# Patient Record
Sex: Female | Born: 1955 | Race: White | Hispanic: No
Health system: Southern US, Community
[De-identification: ages and names within clinical notes are randomized; demographics above are authoritative.]

## PROBLEM LIST (undated history)

## (undated) HISTORY — PX: ABDOMINAL HYSTERECTOMY: SHX81

---

## 2017-08-30 DIAGNOSIS — E039 Hypothyroidism, unspecified: Secondary | ICD-10-CM | POA: Insufficient documentation

## 2017-08-30 DIAGNOSIS — F411 Generalized anxiety disorder: Secondary | ICD-10-CM | POA: Insufficient documentation

## 2017-08-30 DIAGNOSIS — L508 Other urticaria: Secondary | ICD-10-CM | POA: Insufficient documentation

## 2017-08-30 DIAGNOSIS — K219 Gastro-esophageal reflux disease without esophagitis: Secondary | ICD-10-CM | POA: Insufficient documentation

## 2017-11-29 DIAGNOSIS — E669 Obesity, unspecified: Secondary | ICD-10-CM | POA: Insufficient documentation

## 2017-11-29 DIAGNOSIS — F5104 Psychophysiologic insomnia: Secondary | ICD-10-CM | POA: Insufficient documentation

## 2017-11-29 DIAGNOSIS — E119 Type 2 diabetes mellitus without complications: Secondary | ICD-10-CM | POA: Insufficient documentation

## 2018-05-31 DIAGNOSIS — E6609 Other obesity due to excess calories: Secondary | ICD-10-CM | POA: Insufficient documentation

## 2019-01-03 ENCOUNTER — Other Ambulatory Visit: Payer: Self-pay | Admitting: Internal Medicine

## 2019-01-03 DIAGNOSIS — Z1231 Encounter for screening mammogram for malignant neoplasm of breast: Secondary | ICD-10-CM

## 2019-04-03 DIAGNOSIS — L719 Rosacea, unspecified: Secondary | ICD-10-CM | POA: Insufficient documentation

## 2019-10-05 ENCOUNTER — Other Ambulatory Visit: Payer: Self-pay | Admitting: Family Medicine

## 2019-10-05 DIAGNOSIS — Z1231 Encounter for screening mammogram for malignant neoplasm of breast: Secondary | ICD-10-CM

## 2019-10-11 ENCOUNTER — Ambulatory Visit
Admission: RE | Admit: 2019-10-11 | Discharge: 2019-10-11 | Disposition: A | Discharge status: Home / Self Care | Payer: BC Managed Care – PPO | Source: Ambulatory Visit | Attending: Family Medicine | Admitting: Family Medicine

## 2019-10-11 DIAGNOSIS — Z1231 Encounter for screening mammogram for malignant neoplasm of breast: Secondary | ICD-10-CM | POA: Insufficient documentation

## 2019-10-30 ENCOUNTER — Other Ambulatory Visit: Payer: Self-pay | Admitting: Family Medicine

## 2019-10-30 DIAGNOSIS — N6310 Unspecified lump in the right breast, unspecified quadrant: Secondary | ICD-10-CM

## 2019-10-30 DIAGNOSIS — R928 Other abnormal and inconclusive findings on diagnostic imaging of breast: Secondary | ICD-10-CM

## 2019-11-02 ENCOUNTER — Ambulatory Visit
Admission: RE | Admit: 2019-11-02 | Discharge: 2019-11-02 | Disposition: A | Discharge status: Home / Self Care | Payer: BC Managed Care – PPO | Source: Ambulatory Visit | Attending: Family Medicine | Admitting: Family Medicine

## 2019-11-02 DIAGNOSIS — R928 Other abnormal and inconclusive findings on diagnostic imaging of breast: Secondary | ICD-10-CM

## 2019-11-02 DIAGNOSIS — N6310 Unspecified lump in the right breast, unspecified quadrant: Secondary | ICD-10-CM

## 2020-04-09 DIAGNOSIS — E7800 Pure hypercholesterolemia, unspecified: Secondary | ICD-10-CM | POA: Insufficient documentation

## 2020-04-12 ENCOUNTER — Other Ambulatory Visit: Payer: Self-pay | Admitting: Family Medicine

## 2020-04-12 DIAGNOSIS — N6310 Unspecified lump in the right breast, unspecified quadrant: Secondary | ICD-10-CM

## 2020-04-29 ENCOUNTER — Ambulatory Visit
Admission: RE | Admit: 2020-04-29 | Discharge: 2020-04-29 | Disposition: A | Discharge status: Home / Self Care | Payer: BC Managed Care – PPO | Source: Ambulatory Visit | Attending: Family Medicine | Admitting: Family Medicine

## 2020-04-29 DIAGNOSIS — N6310 Unspecified lump in the right breast, unspecified quadrant: Secondary | ICD-10-CM

## 2020-04-30 ENCOUNTER — Other Ambulatory Visit: Payer: Self-pay | Admitting: Family Medicine

## 2020-04-30 ENCOUNTER — Encounter: Payer: Self-pay | Admitting: Obstetrics and Gynecology

## 2020-04-30 ENCOUNTER — Ambulatory Visit (INDEPENDENT_AMBULATORY_CARE_PROVIDER_SITE_OTHER): Payer: BC Managed Care – PPO | Admitting: Obstetrics and Gynecology

## 2020-04-30 ENCOUNTER — Other Ambulatory Visit: Payer: Self-pay

## 2020-04-30 VITALS — BP 132/70 | Ht 62.0 in | Wt 178.6 lb

## 2020-04-30 DIAGNOSIS — Z01419 Encounter for gynecological examination (general) (routine) without abnormal findings: Secondary | ICD-10-CM

## 2020-04-30 DIAGNOSIS — N6002 Solitary cyst of left breast: Secondary | ICD-10-CM

## 2020-04-30 DIAGNOSIS — Z1231 Encounter for screening mammogram for malignant neoplasm of breast: Secondary | ICD-10-CM

## 2020-04-30 DIAGNOSIS — N6001 Solitary cyst of right breast: Secondary | ICD-10-CM

## 2020-04-30 NOTE — Patient Instructions (Signed)
Institute of Medicine Recommended Dietary Allowances for Calcium and Vitamin D  Age (yr) Calcium Recommended Dietary Allowance (mg/day) Vitamin D Recommended Dietary Allowance (international units/day)  9-18 1,300 600  19-50 1,000 600  51-70 1,200 600  71 and older 1,200 800  Data from Institute of Medicine. Dietary reference intakes: calcium, vitamin D. [place], [place]: National Academies Press; 2011.     Exercising to Stay Healthy To become healthy and stay healthy, it is recommended that you do moderate-intensity and vigorous-intensity exercise. You can tell that you are exercising at a moderate intensity if your heart starts beating faster and you start breathing faster but can still hold a conversation. You can tell that you are exercising at a vigorous intensity if you are breathing much harder and faster and cannot hold a conversation while exercising. Exercising regularly is important. It has many health benefits, such as:  Improving overall fitness, flexibility, and endurance.  Increasing bone density.  Helping with weight control.  Decreasing body fat.  Increasing muscle strength.  Reducing stress and tension.  Improving overall health. How often should I exercise? Choose an activity that you enjoy, and set realistic goals. Your health care provider can help you make an activity plan that works for you. Exercise regularly as told by your health care provider. This may include:  Doing strength training two times a week, such as: ? Lifting weights. ? Using resistance bands. ? Push-ups. ? Sit-ups. ? Yoga.  Doing a certain intensity of exercise for a given amount of time. Choose from these options: ? A total of 150 minutes of moderate-intensity exercise every week. ? A total of 75 minutes of vigorous-intensity exercise every week. ? A mix of moderate-intensity and vigorous-intensity exercise every week. Children, pregnant women, people who have not exercised  regularly, people who are overweight, and older adults may need to talk with a health care provider about what activities are safe to do. If you have a medical condition, be sure to talk with your health care provider before you start a new exercise program. What are some exercise ideas? Moderate-intensity exercise ideas include:  Walking 1 mile (1.6 km) in about 15 minutes.  Biking.  Hiking.  Golfing.  Dancing.  Water aerobics. Vigorous-intensity exercise ideas include:  Walking 4.5 miles (7.2 km) or more in about 1 hour.  Jogging or running 5 miles (8 km) in about 1 hour.  Biking 10 miles (16.1 km) or more in about 1 hour.  Lap swimming.  Roller-skating or in-line skating.  Cross-country skiing.  Vigorous competitive sports, such as football, basketball, and soccer.  Jumping rope.  Aerobic dancing. What are some everyday activities that can help me to get exercise?  Yard work, such as: ? Pushing a lawn mower. ? Raking and bagging leaves.  Washing your car.  Pushing a stroller.  Shoveling snow.  Gardening.  Washing windows or floors. How can I be more active in my day-to-day activities?  Use stairs instead of an elevator.  Take a walk during your lunch break.  If you drive, park your car farther away from your work or school.  If you take public transportation, get off one stop early and walk the rest of the way.  Stand up or walk around during all of your indoor phone calls.  Get up, stretch, and walk around every 30 minutes throughout the day.  Enjoy exercise with a friend. Support to continue exercising will help you keep a regular routine of activity. What guidelines can   I follow while exercising?  Before you start a new exercise program, talk with your health care provider.  Do not exercise so much that you hurt yourself, feel dizzy, or get very short of breath.  Wear comfortable clothes and wear shoes with good support.  Drink plenty of  water while you exercise to prevent dehydration or heat stroke.  Work out until your breathing and your heartbeat get faster. Where to find more information  [place] Department of Health and Human Services: www.hhs.gov  Centers for Disease Control and Prevention (CDC): www.cdc.gov Summary  Exercising regularly is important. It will improve your overall fitness, flexibility, and endurance.  Regular exercise also will improve your overall health. It can help you control your weight, reduce stress, and improve your bone density.  Do not exercise so much that you hurt yourself, feel dizzy, or get very short of breath.  Before you start a new exercise program, talk with your health care provider. This information is not intended to replace advice given to you by your health care provider. Make sure you discuss any questions you have with your health care provider. Document Revised: 10/01/2017 Document Reviewed: 09/09/2017 Elsevier Patient Education  2020 Elsevier Inc.   Budget-Friendly Healthy Eating There are many ways to save money at the grocery store and continue to eat healthy. You can be successful if you:  Plan meals according to your budget.  Make a grocery list and only purchase food according to your grocery list.  Prepare food yourself. What are tips for following this plan?  Reading food labels  Compare food labels between brand name foods and the store brand. Often the nutritional value is the same, but the store brand is lower cost.  Look for products that do not have added sugar, fat, or salt (sodium). These often cost the same but are healthier for you. Products may be labeled as: ? Sugar-free. ? Nonfat. ? Low-fat. ? Sodium-free. ? Low-sodium.  Look for lean ground beef labeled as at least 92% lean and 8% fat. Shopping  Buy only the items on your grocery list and go only to the areas of the store that have the items on your list.  Use coupons only for foods  and brands you normally buy. Avoid buying items you wouldn't normally buy simply because they are on sale.  Check online and in newspapers for weekly deals.  Buy healthy items from the bulk bins when available, such as herbs, spices, flour, pasta, nuts, and dried fruit.  Buy fruits and vegetables that are in season. Prices are usually lower on in-season produce.  Look at the unit price on the price tag. Use it to compare different brands and sizes to find out which item is the best deal.  Choose healthy items that are often low-cost, such as carrots, potatoes, apples, bananas, and oranges. Dried or canned beans are a low-cost protein source.  Buy in bulk and freeze extra food. Items you can buy in bulk include meats, fish, poultry, frozen fruits, and frozen vegetables.  Avoid buying "ready-to-eat" foods, such as pre-cut fruits and vegetables and pre-made salads.  If possible, shop around to discover where you can find the best prices. Consider other retailers such as dollar stores, larger wholesale stores, local fruit and vegetable stands, and farmers markets.  Do not shop when you are hungry. If you shop while hungry, it may be hard to stick to your list and budget.  Resist impulse buying. Use your grocery list as   your official plan for the week.  Buy a variety of vegetables and fruits by purchasing fresh, frozen, and canned items.  Look at the top and bottom shelves for deals. Foods at eye level (eye level of an adult or child) are usually more expensive.  Be efficient with your time when shopping. The more time you spend at the store, the more money you are likely to spend.  To save money when choosing more expensive foods like meats and dairy: ? Choose cheaper cuts of meat, such as bone-in chicken thighs and drumsticks instead of skinless and boneless chicken. When you are ready to prepare the chicken, you can remove the skin yourself to make it healthier. ? Choose lean meats like  chicken or turkey instead of beef. ? Choose canned seafood, such as tuna, salmon, or sardines. ? Buy eggs as a low-cost source of protein. ? Buy dried beans and peas, such as lentils, split peas, or kidney beans instead of meats. Dried beans and peas are a good alternative source of protein. ? Buy the larger tubs of yogurt instead of individual-sized containers.  Choose water instead of sodas and other sweetened beverages.  Avoid buying chips, cookies, and other "junk food." These items are usually expensive and not healthy. Cooking  Make extra food and freeze the extras in meal-sized containers or in individual portions for fast meals and snacks.  Pre-cook on days when you have extra time to prepare meals in advance. You can keep these meals in the fridge or freezer and reheat for a quick meal.  When you come home from the grocery store, wash, peel, and cut fruits and vegetables so they are ready to use and eat. This will help reduce food waste. Meal planning  Do not eat out or get fast food. Prepare food at home.  Make a grocery list and make sure to bring it with you to the store. If you have a smart phone, you could use your phone to create your shopping list.  Plan meals and snacks according to a grocery list and budget you create.  Use leftovers in your meal plan for the week.  Look for recipes where you can cook once and make enough food for two meals.  Include budget-friendly meals like stews, casseroles, and stir-fry dishes.  Try some meatless meals or try "no cook" meals like salads.  Make sure that half your plate is filled with fruits or vegetables. Choose from fresh, frozen, or canned fruits and vegetables. If eating canned, remember to rinse them before eating. This will remove any excess salt added for packaging. Summary  Eating healthy on a budget is possible if you plan your meals according to your budget, purchase according to your budget and grocery list, and  prepare food yourself.  Tips for buying more food on a limited budget include buying generic brands, using coupons only for foods you normally buy, and buying healthy items from the bulk bins when available.  Tips for buying cheaper food to replace expensive food include choosing cheaper, lean cuts of meat, and buying dried beans and peas. This information is not intended to replace advice given to you by your health care provider. Make sure you discuss any questions you have with your health care provider. Document Revised: 10/20/2017 Document Reviewed: 10/20/2017 Elsevier Patient Education  2020 Elsevier Inc.   Bone Health Bones protect organs, store calcium, anchor muscles, and support the whole body. Keeping your bones strong is important, especially as you   get older. You can take actions to help keep your bones strong and healthy. Why is keeping my bones healthy important?  Keeping your bones healthy is important because your body constantly replaces bone cells. Cells get old, and new cells take their place. As we age, we lose bone cells because the body may not be able to make enough new cells to replace the old cells. The amount of bone cells and bone tissue you have is referred to as bone mass. The higher your bone mass, the stronger your bones. The aging process leads to an overall loss of bone mass in the body, which can increase the likelihood of:  Joint pain and stiffness.  Broken bones.  A condition in which the bones become weak and brittle (osteoporosis). A large decline in bone mass occurs in older adults. In women, it occurs about the time of menopause. What actions can I take to keep my bones healthy? Good health habits are important for maintaining healthy bones. This includes eating nutritious foods and exercising regularly. To have healthy bones, you need to get enough of the right minerals and vitamins. Most nutrition experts recommend getting these nutrients from the  foods that you eat. In some cases, taking supplements may also be recommended. Doing certain types of exercise is also important for bone health. What are the nutritional recommendations for healthy bones?  Eating a well-balanced diet with plenty of calcium and vitamin D will help to protect your bones. Nutritional recommendations vary from person to person. Ask your health care provider what is healthy for you. Here are some general guidelines. Get enough calcium Calcium is the most important (essential) mineral for bone health. Most people can get enough calcium from their diet, but supplements may be recommended for people who are at risk for osteoporosis. Good sources of calcium include:  Dairy products, such as low-fat or nonfat milk, cheese, and yogurt.  Dark green leafy vegetables, such as bok choy and broccoli.  Calcium-fortified foods, such as orange juice, cereal, bread, soy beverages, and tofu products.  Nuts, such as almonds. Follow these recommended amounts for daily calcium intake:  Children, age 1-3: 700 mg.  Children, age 4-8: 1,000 mg.  Children, age 9-13: 1,300 mg.  Teens, age 14-18: 1,300 mg.  Adults, age 19-50: 1,000 mg.  Adults, age 51-70: ? Men: 1,000 mg. ? Women: 1,200 mg.  Adults, age 71 or older: 1,200 mg.  Pregnant and breastfeeding females: ? Teens: 1,300 mg. ? Adults: 1,000 mg. Get enough vitamin D Vitamin D is the most essential vitamin for bone health. It helps the body absorb calcium. Sunlight stimulates the skin to make vitamin D, so be sure to get enough sunlight. If you live in a cold climate or you do not get outside often, your health care provider may recommend that you take vitamin D supplements. Good sources of vitamin D in your diet include:  Egg yolks.  Saltwater fish.  Milk and cereal fortified with vitamin D. Follow these recommended amounts for daily vitamin D intake:  Children and teens, age 1-18: 600 international  units.  Adults, age 50 or younger: 400-800 international units.  Adults, age 51 or older: 800-1,000 international units. Get other important nutrients Other nutrients that are important for bone health include:  Phosphorus. This mineral is found in meat, poultry, dairy foods, nuts, and legumes. The recommended daily intake for adult men and adult women is 700 mg.  Magnesium. This mineral is found in seeds, nuts, dark   green vegetables, and legumes. The recommended daily intake for adult men is 400-420 mg. For adult women, it is 310-320 mg.  Vitamin K. This vitamin is found in green leafy vegetables. The recommended daily intake is 120 mg for adult men and 90 mg for adult women. What type of physical activity is best for building and maintaining healthy bones? Weight-bearing and strength-building activities are important for building and maintaining healthy bones. Weight-bearing activities cause muscles and bones to work against gravity. Strength-building activities increase the strength of the muscles that support bones. Weight-bearing and muscle-building activities include:  Walking and hiking.  Jogging and running.  Dancing.  Gym exercises.  Lifting weights.  Tennis and racquetball.  Climbing stairs.  Aerobics. Adults should get at least 30 minutes of moderate physical activity on most days. Children should get at least 60 minutes of moderate physical activity on most days. Ask your health care provider what type of exercise is best for you. How can I find out if my bone mass is low? Bone mass can be measured with an X-ray test called a bone mineral density (BMD) test. This test is recommended for all women who are age 65 or older. It may also be recommended for:  Men who are age 70 or older.  People who are at risk for osteoporosis because of: ? Having bones that break easily. ? Having a long-term disease that weakens bones, such as kidney disease or rheumatoid  arthritis. ? Having menopause earlier than normal. ? Taking medicine that weakens bones, such as steroids, thyroid hormones, or hormone treatment for breast cancer or prostate cancer. ? Smoking. ? Drinking three or more alcoholic drinks a day. If you find that you have a low bone mass, you may be able to prevent osteoporosis or further bone loss by changing your diet and lifestyle. Where can I find more information? For more information, check out the following websites:  National Osteoporosis Foundation: www.nof.org/patients  National Institutes of Health: www.bones.nih.gov  International Osteoporosis Foundation: www.iofbonehealth.org Summary  The aging process leads to an overall loss of bone mass in the body, which can increase the likelihood of broken bones and osteoporosis.  Eating a well-balanced diet with plenty of calcium and vitamin D will help to protect your bones.  Weight-bearing and strength-building activities are also important for building and maintaining strong bones.  Bone mass can be measured with an X-ray test called a bone mineral density (BMD) test. This information is not intended to replace advice given to you by your health care provider. Make sure you discuss any questions you have with your health care provider. Document Revised: 11/15/2017 Document Reviewed: 11/15/2017 Elsevier Patient Education  2020 Elsevier Inc.   

## 2020-04-30 NOTE — Progress Notes (Signed)
Patient ID: Shelby Clements, female   DOB: 12/13/1955, 64 y.o.   MRN: [id number]  Reason for Consult: Gynecologic Exam   Referred by No ref. provider found  Subjective:     HPI:  Shelby Clements is a 64 y.o. female. She reports that she may of had a partial hysterectomy in the past. She is uncertain if her cervix is remaining. She reports she did not have a history of abnormal pap smears before her hysterectomy. She still has her ovaries and reports she did not need to take hormones.  Gynecological History Hx of hysterectomy in 74s, still has ovaries. Cervix was removed per today's vaginal exam Last Mammogram: June 2021, is having a 5 mm complicated cyst monitored Sexually Active: yes, no pain   History reviewed. No pertinent past medical history. Family History  Problem Relation Age of Onset  . Breast cancer Neg Hx    History reviewed. No pertinent surgical history.  Short Social History:  Social History   Tobacco Use  . Smoking status: Not on file  Substance Use Topics  . Alcohol use: Not on file    Not on File  Current Outpatient Medications  Medication Sig Dispense Refill  . doxycycline (PERIOSTAT) 20 MG tablet Take by mouth.    . fluticasone (FLONASE) 50 MCG/ACT nasal spray Place into the nose.    . levothyroxine (SYNTHROID) 50 MCG tablet Take by mouth.    Marland Kitchen omeprazole (PRILOSEC) 20 MG capsule Take 1 tablet by mouth daily.    . pravastatin (PRAVACHOL) 20 MG tablet Take by mouth.    . sertraline (ZOLOFT) 100 MG tablet Take 1 tablet by mouth daily.     No current facility-administered medications for this visit.    Review of Systems  Constitutional: Negative for chills, fatigue, fever and unexpected weight change.  HENT: Negative for trouble swallowing.  Eyes: Negative for loss of vision.  Respiratory: Negative for cough, shortness of breath and wheezing.  Cardiovascular: Negative for chest pain, leg swelling, palpitations and syncope.  GI: Negative for  abdominal pain, blood in stool, diarrhea, nausea and vomiting.  GU: Negative for difficulty urinating, dysuria, frequency and hematuria.  Musculoskeletal: Negative for back pain, leg pain and joint pain.  Skin: Negative for rash.  Neurological: Negative for dizziness, headaches, light-headedness, numbness and seizures.  Psychiatric: Negative for behavioral problem, confusion, depressed mood and sleep disturbance.        Objective:  Objective   Vitals:   04/30/20 1041  BP: 132/70  Weight: 178 lb 9.6 oz (81 kg)  Height: 5\' 2"  (1.575 m)   Body mass index is 32.67 kg/m.  Physical Exam Vitals and nursing note reviewed.  Constitutional:      Appearance: She is well-developed.  HENT:     Head: Normocephalic and atraumatic.  Eyes:     Pupils: Pupils are equal, round, and reactive to light.  Cardiovascular:     Rate and Rhythm: Normal rate and regular rhythm.  Pulmonary:     Effort: Pulmonary effort is normal. No respiratory distress.  Abdominal:     General: Abdomen is flat.     Palpations: Abdomen is soft.  Genitourinary:    Comments: External: Normal appearing vulva. No lesions noted.  Speculum examination: Normal appearing cuff. Cervix absent. No blood in the vaginal vault. No discharge.  Bimanual examination: Uterus absent. No adnexal masses. No adnexal tenderness. Pelvis not fixed.  Skin:    General: Skin is warm and dry.  Neurological:  Mental Status: She is alert and oriented to person, place, and time.  Psychiatric:        Behavior: Behavior normal.        Thought Content: Thought content normal.        Judgment: Judgment normal.        Assessment/Plan:     64 yo  Pelvic exam normal today Cervix is surgically absent- pap smears can be discontinues Recommended annual pelvic exam to monitor ovaries.  Follow up of Mammogram planned for December, PCP is managing. Recent colonoscopy, advised to repeat in 5 years.   More than 30 minutes were spent face to  face with the patient in the room, reviewing the medical record, labs and images, and coordinating care for the patient. The plan of management was discussed in detail and counseling was provided.    Adelene Idler MD [facility], [facility] 04/30/2020 11:21 AM

## 2020-10-11 ENCOUNTER — Other Ambulatory Visit: Payer: Self-pay

## 2020-10-11 ENCOUNTER — Ambulatory Visit
Admission: RE | Admit: 2020-10-11 | Discharge: 2020-10-11 | Disposition: A | Discharge status: Home / Self Care | Payer: BC Managed Care – PPO | Source: Ambulatory Visit | Attending: Family Medicine | Admitting: Family Medicine

## 2020-10-11 DIAGNOSIS — N6001 Solitary cyst of right breast: Secondary | ICD-10-CM

## 2020-10-11 DIAGNOSIS — Z1231 Encounter for screening mammogram for malignant neoplasm of breast: Secondary | ICD-10-CM | POA: Insufficient documentation

## 2021-09-08 ENCOUNTER — Other Ambulatory Visit: Payer: Self-pay

## 2021-09-08 ENCOUNTER — Ambulatory Visit (INDEPENDENT_AMBULATORY_CARE_PROVIDER_SITE_OTHER): Payer: Medicare HMO | Admitting: Obstetrics and Gynecology

## 2021-09-08 ENCOUNTER — Encounter: Payer: Self-pay | Admitting: Obstetrics and Gynecology

## 2021-09-08 VITALS — BP 120/70 | Ht 62.0 in | Wt 189.8 lb

## 2021-09-08 DIAGNOSIS — Z13820 Encounter for screening for osteoporosis: Secondary | ICD-10-CM

## 2021-09-08 DIAGNOSIS — Z01419 Encounter for gynecological examination (general) (routine) without abnormal findings: Secondary | ICD-10-CM | POA: Diagnosis not present

## 2021-09-08 DIAGNOSIS — Z1231 Encounter for screening mammogram for malignant neoplasm of breast: Secondary | ICD-10-CM | POA: Diagnosis not present

## 2021-09-08 DIAGNOSIS — N6001 Solitary cyst of right breast: Secondary | ICD-10-CM | POA: Diagnosis not present

## 2021-09-08 DIAGNOSIS — Z0000 Encounter for general adult medical examination without abnormal findings: Secondary | ICD-10-CM

## 2021-09-08 NOTE — Progress Notes (Signed)
Gynecology Annual Exam  PCP: Marisue Ivan, MD  Chief Complaint:  Chief Complaint  Patient presents with   Gynecologic Exam    History of Present Illness: Patient is a 65 y.o. G2P2002 presents for annual exam. The patient has no complaints today.   LMP: No LMP recorded. Patient has had a hysterectomy. She denies postmenopausal bleeding or spotting  The patient is sexually active. She denies dyspareunia.  Postcoital Bleeding: no   The patient does perform self breast exams.  There is no notable family history of breast or ovarian cancer in her family.  The patient has regular exercise: walking and strength class 4 times a week  The patient denies current symptoms of depression.   PHQ-9: 2 GAD-7: 1   Review of Systems: Review of Systems  Constitutional:  Negative for chills, fever, malaise/fatigue and weight loss.  HENT:  Negative for congestion, hearing loss and sinus pain.   Eyes:  Negative for blurred vision and double vision.  Respiratory:  Negative for cough, sputum production, shortness of breath and wheezing.   Cardiovascular:  Negative for chest pain, palpitations, orthopnea and leg swelling.  Gastrointestinal:  Negative for abdominal pain, constipation, diarrhea, nausea and vomiting.  Genitourinary:  Negative for dysuria, flank pain, frequency, hematuria and urgency.  Musculoskeletal:  Negative for back pain, falls and joint pain.  Skin:  Negative for itching and rash.  Neurological:  Negative for dizziness and headaches.  Psychiatric/Behavioral:  Negative for depression, substance abuse and suicidal ideas. The patient is not nervous/anxious.    Past Medical History:  History reviewed. No pertinent past medical history.  Past Surgical History:  History reviewed. No pertinent surgical history.  Gynecologic History:  No LMP recorded. Patient has had a hysterectomy. Obstetric History: [id number]  Family History:  Family History  Problem Relation Age of  Onset   Breast cancer Neg Hx     Social History:  Social History   Socioeconomic History   Marital status: Unknown    Spouse name: Not on file   Number of children: Not on file   Years of education: Not on file   Highest education level: Not on file  Occupational History   Not on file  Tobacco Use   Smoking status: Not on file   Smokeless tobacco: Not on file  Substance and Sexual Activity   Alcohol use: Not on file   Drug use: Not on file   Sexual activity: Not on file  Other Topics Concern   Not on file  Social History Narrative   Not on file   Social Determinants of Health   Financial Resource Strain: Not on file  Food Insecurity: Not on file  Transportation Needs: Not on file  Physical Activity: Not on file  Stress: Not on file  Social Connections: Not on file  Intimate Partner Violence: Not on file    Allergies:  No Known Allergies  Medications: Prior to Admission medications   Medication Sig Start Date End Date Taking? Authorizing Provider  fluticasone (FLONASE) 50 MCG/ACT nasal spray Place into the nose. 05/06/18  Yes [provider]  levothyroxine (SYNTHROID) 50 MCG tablet Take by mouth. 04/09/20  Yes [provider]  omeprazole (PRILOSEC) 20 MG capsule Take 1 tablet by mouth daily. 02/05/20  Yes [provider]  sertraline (ZOLOFT) 100 MG tablet Take 1 tablet by mouth daily. 01/19/20  Yes [provider]  doxycycline (PERIOSTAT) 20 MG tablet Take by mouth. Patient not taking: Reported on 09/08/2021 09/19/17  [provider]  pravastatin (PRAVACHOL) 20 MG tablet Take by mouth. 04/09/20 04/09/21  [provider]    Physical Exam Vitals: Blood pressure 120/70, height 5\' 2"  (1.575 m), weight 189 lb 12.8 oz (86.1 kg).  Physical Exam Constitutional:      Appearance: She is well-developed.  Genitourinary:     Genitourinary Comments: External: Normal appearing vulva. No lesions noted.   Bimanual examination:  Uterus absent. No adnexal masses. No adnexal tenderness. Pelvis not fixed.  Breast Exam: breast equal without skin changes, nipple discharge, breast lump or enlarged lymph nodes   HENT:     Head: Normocephalic and atraumatic.  Neck:     Thyroid: No thyromegaly.  Cardiovascular:     Rate and Rhythm: Normal rate and regular rhythm.     Heart sounds: Normal heart sounds.  Pulmonary:     Effort: Pulmonary effort is normal.     Breath sounds: Normal breath sounds.  Abdominal:     General: Bowel sounds are normal. There is no distension.     Palpations: Abdomen is soft. There is no mass.  Musculoskeletal:     Cervical back: Neck supple.  Neurological:     Mental Status: She is alert and oriented to person, place, and time.  Skin:    General: Skin is warm and dry.  Psychiatric:        Behavior: Behavior normal.        Thought Content: Thought content normal.        Judgment: Judgment normal.  Vitals reviewed.     Female chaperone present for pelvic and breast  portions of the physical exam  Assessment: 65 y.o. G2P2002 routine annual exam  Plan: Problem List Items Addressed This Visit   None Visit Diagnoses     Encounter for annual routine gynecological examination    -  Primary   Cyst of right breast       Relevant Orders   76 BREAST LTD UNI RIGHT INC AXILLA (Completed)   Breast cancer screening by mammogram       Screening for osteoporosis       Health maintenance examination           1) Mammogram - recommend yearly screening mammogram.  Mammogram ordered today  2) STI screening was offered and declined  3) pap smears discontinued secondary to hysterectomy  4) Colonoscopy --advised   5) Routine healthcare maintenance including cholesterol, diabetes screening discussed managed by PCP  6) Osteoporosis screening - dexa ordered  US MD, Adelene [facility], [facility] 02/12/2022 1:13 PM

## 2021-09-08 NOTE — Patient Instructions (Addendum)
Institute of Medicine Recommended Dietary Allowances for Calcium and Vitamin D  Age (yr) Calcium Recommended Dietary Allowance (mg/day) Vitamin D Recommended Dietary Allowance (international units/day)  9-18 1,300 600  19-50 1,000 600  51-70 1,200 600  71 and older 1,200 800  Data from Institute of Medicine. Dietary reference intakes: calcium, vitamin D. [place], [place]: National Academies Press; 2011.   Exercising to Stay Healthy To become healthy and stay healthy, it is recommended that you do moderate-intensity and vigorous-intensity exercise. You can tell that you are exercising at a moderate intensity if your heart starts beating faster and you start breathing faster but can still hold a conversation. You can tell that you are exercising at a vigorous intensity if you are breathing much harder and faster and cannot hold a conversation while exercising. How can exercise benefit me? Exercising regularly is important. It has many health benefits, such as: Improving overall fitness, flexibility, and endurance. Increasing bone density. Helping with weight control. Decreasing body fat. Increasing muscle strength and endurance. Reducing stress and tension, anxiety, depression, or anger. Improving overall health. What guidelines should I follow while exercising? Before you start a new exercise program, talk with your health care provider. Do not exercise so much that you hurt yourself, feel dizzy, or get very short of breath. Wear comfortable clothes and wear shoes with good support. Drink plenty of water while you exercise to prevent dehydration or heat stroke. Work out until your breathing and your heartbeat get faster (moderate intensity). How often should I exercise? Choose an activity that you enjoy, and set realistic goals. Your health care provider can help you make an activity plan that is individually designed and works best for you. Exercise regularly as told by your health  care provider. This may include: Doing strength training two times a week, such as: Lifting weights. Using resistance bands. Push-ups. Sit-ups. Yoga. Doing a certain intensity of exercise for a given amount of time. Choose from these options: A total of 150 minutes of moderate-intensity exercise every week. A total of 75 minutes of vigorous-intensity exercise every week. A mix of moderate-intensity and vigorous-intensity exercise every week. Children, pregnant women, people who have not exercised regularly, people who are overweight, and older adults may need to talk with a health care provider about what activities are safe to perform. If you have a medical condition, be sure to talk with your health care provider before you start a new exercise program. What are some exercise ideas? Moderate-intensity exercise ideas include: Walking 1 mile (1.6 km) in about 15 minutes. Biking. Hiking. Golfing. Dancing. Water aerobics. Vigorous-intensity exercise ideas include: Walking 4.5 miles (7.2 km) or more in about 1 hour. Jogging or running 5 miles (8 km) in about 1 hour. Biking 10 miles (16.1 km) or more in about 1 hour. Lap swimming. Roller-skating or in-line skating. Cross-country skiing. Vigorous competitive sports, such as football, basketball, and soccer. Jumping rope. Aerobic dancing. What are some everyday activities that can help me get exercise? Yard work, such as: Pushing a lawn mower. Raking and bagging leaves. Washing your car. Pushing a stroller. Shoveling snow. Gardening. Washing windows or floors. How can I be more active in my day-to-day activities? Use stairs instead of an elevator. Take a walk during your lunch break. If you drive, park your car farther away from your work or school. If you take public transportation, get off one stop early and walk the rest of the way. Stand up or walk around during all of   your indoor phone calls. Get up, stretch, and walk  around every 30 minutes throughout the day. Enjoy exercise with a friend. Support to continue exercising will help you keep a regular routine of activity. Where to find more information You can find more information about exercising to stay healthy from: U.S. Department of Health and Human Services: www.hhs.gov Centers for Disease Control and Prevention (CDC): www.cdc.gov Summary Exercising regularly is important. It will improve your overall fitness, flexibility, and endurance. Regular exercise will also improve your overall health. It can help you control your weight, reduce stress, and improve your bone density. Do not exercise so much that you hurt yourself, feel dizzy, or get very short of breath. Before you start a new exercise program, talk with your health care provider. This information is not intended to replace advice given to you by your health care provider. Make sure you discuss any questions you have with your health care provider. Document Revised: 02/14/2021 Document Reviewed: 02/14/2021 Elsevier Patient Education  2022 Elsevier Inc. Budget-Friendly Healthy Eating There are many ways to save money at the grocery store and continue to eat healthy. You can be successful if you: Plan meals according to your budget. Make a grocery list and only purchase food according to your grocery list. Prepare food yourself at home. What are tips for following this plan? Reading food labels Compare food labels between brand name foods and the store brand. Often the nutritional value is the same, but the store brand is lower cost. Look for products that do not have added sugar, fat, or salt (sodium). These often cost the same but are healthier for you. Products may be labeled as: Sugar-free. Nonfat. Low-fat. Sodium-free. Low-sodium. Look for lean ground beef labeled as at least 92% lean and 8% fat. Shopping  Buy only the items on your grocery list and go only to the areas of the store  that have the items on your list. Use coupons only for foods and brands you normally buy. Avoid buying items you wouldn't normally buy simply because they are on sale. Check online and in newspapers for weekly deals. Buy healthy items from the bulk bins when available, such as herbs, spices, flour, pasta, nuts, and dried fruit. Buy fruits and vegetables that are in season. Prices are usually lower on in-season produce. Look at the unit price on the price tag. Use it to compare different brands and sizes to find out which item is the best deal. Choose healthy items that are often low-cost, such as carrots, potatoes, apples, bananas, and oranges. Dried or canned beans are a low-cost protein source. Buy in bulk and freeze extra food. Items you can buy in bulk include meats, fish, poultry, frozen fruits, and frozen vegetables. Avoid buying "ready-to-eat" foods, such as pre-cut fruits and vegetables and pre-made salads. If possible, shop around to discover where you can find the best prices. Consider other retailers such as dollar stores, larger wholesale stores, local fruit and vegetable stands, and farmers markets. Do not shop when you are hungry. If you shop while hungry, it may be hard to stick to your list and budget. Resist impulse buying. Use your grocery list as your official plan for the week. Buy a variety of vegetables and fruits by purchasing fresh, frozen, and canned items. Look at the top and bottom shelves for deals. Foods at eye level (eye level of an adult or child) are usually more expensive. Be efficient with your time when shopping. The more time you   spend at the store, the more money you are likely to spend. To save money when choosing more expensive foods like meats and dairy: Choose cheaper cuts of meat, such as bone-in chicken thighs and drumsticks instead of skinless and boneless chicken. When you are ready to prepare the chicken, you can remove the skin yourself to make it  healthier. Choose lean meats like chicken or turkey instead of beef. Choose canned seafood, such as tuna, salmon, or sardines. Buy eggs as a low-cost source of protein. Buy dried beans and peas, such as lentils, split peas, or kidney beans instead of meats. Dried beans and peas are a good alternative source of protein. Buy the larger tubs of yogurt instead of individual-sized containers. Choose water instead of sodas and other sweetened beverages. Avoid buying chips, cookies, and other "junk food." These items are usually expensive and not healthy. Cooking Make extra food and freeze the extras in meal-sized containers or in individual portions for fast meals and snacks. Pre-cook on days when you have extra time to prepare meals in advance. You can keep these meals in the fridge or freezer and reheat for a quick meal. When you come home from the grocery store, wash, peel, and cut fruits and vegetables so they are ready to use and eat. This will help reduce food waste. Meal planning Do not eat out or get fast food. Prepare food at home. Make a grocery list and make sure to bring it with you to the store. If you have a smart phone, you could use your phone to create your shopping list. Plan meals and snacks according to a grocery list and budget you create. Use leftovers in your meal plan for the week. Look for recipes where you can cook once and make enough food for two meals. Prepare budget-friendly types of meals like stews, casseroles, and stir-fry dishes. Try some meatless meals or try "no cook" meals like salads. Make sure that half your plate is filled with fruits or vegetables. Choose from fresh, frozen, or canned fruits and vegetables. If eating canned, remember to rinse them before eating. This will remove any excess salt added for packaging. Summary Eating healthy on a budget is possible if you plan your meals according to your budget, purchase according to your budget and grocery list,  and prepare food yourself. Tips for buying more food on a limited budget include buying generic brands, using coupons only for foods you normally buy, and buying healthy items from the bulk bins when available. Tips for buying cheaper food to replace expensive food include choosing cheaper, lean cuts of meat, and buying dried beans and peas. This information is not intended to replace advice given to you by your health care provider. Make sure you discuss any questions you have with your health care provider. Document Revised: 08/01/2020 Document Reviewed: 08/01/2020 Elsevier Patient Education  2022 Elsevier Inc. Bone Health Bones protect organs, store calcium, anchor muscles, and support the whole body. Keeping your bones strong is important, especially as you get older. You can take actions to help keep your bones strong and healthy. Why is keeping my bones healthy important? Keeping your bones healthy is important because your body constantly replaces bone cells. Cells get old, and new cells take their place. As we age, we lose bone cells because the body may not be able to make enough new cells to replace the old cells. The amount of bone cells and bone tissue you have is referred to as   bone mass. The higher your bone mass, the stronger your bones. The aging process leads to an overall loss of bone mass in the body, which can increase the likelihood of: Broken bones. A condition in which the bones become weak and brittle (osteoporosis). A large decline in bone mass occurs in older adults. In women, it occurs about the time of menopause. What actions can I take to keep my bones healthy? Good health habits are important for maintaining healthy bones. This includes eating nutritious foods and exercising regularly. To have healthy bones, you need to get enough of the right minerals and vitamins. Most nutrition experts recommend getting these nutrients from the foods that you eat. In some cases,  taking supplements may also be recommended. Doing certain types of exercise is also important for bone health. What are the nutritional recommendations for healthy bones? Eating a well-balanced diet with plenty of calcium and vitamin D will help to protect your bones. Nutritional recommendations vary from person to person. Ask your health care provider what is healthy for you. Here are some general guidelines. Get enough calcium Calcium is the most important (essential) mineral for bone health. Most people can get enough calcium from their diet, but supplements may be recommended for people who are at risk for osteoporosis. Good sources of calcium include: Dairy products, such as low-fat or nonfat milk, cheese, and yogurt. Dark green leafy vegetables, such as bok choy and broccoli. Foods that have calcium added to them (are fortified). Foods that may be fortified with calcium include orange juice, cereal, bread, soy beverages, and tofu products. Nuts, such as almonds. Follow these recommended amounts for daily calcium intake: Infants, 0-6 months: 200 mg. Infants, 6-12 months: 260 mg. Children, age 1-3: 700 mg. Children, age 4-8: 1,000 mg. Children, age 9-13: 1,300 mg. Teens, age 14-18: 1,300 mg. Adults, age 19-50: 1,000 mg. Adults, age 51-70: Men: 1,000 mg. Women: 1,200 mg. Adults, age 71 or older: 1,200 mg. Pregnant and breastfeeding females: Teens: 1,300 mg. Adults: 1,000 mg. Get enough vitamin D Vitamin D is the most essential vitamin for bone health. It helps the body absorb calcium. Sunlight stimulates the skin to make vitamin D, so be sure to get enough sunlight. If you live in a cold climate or you do not get outside often, your health care provider may recommend that you take vitamin D supplements. Good sources of vitamin D in your diet include: Egg yolks. Saltwater fish. Milk and cereal fortified with vitamin D. Follow these recommended amounts for daily vitamin D  intake: Infants, 0-12 months: 400 international units (IU). Children and teens, age 1-18: 600 international units. Adults, age 59 or younger: 600 international units. Adults, age 60 or older: 600-1,000 international units. Get other important nutrients Other nutrients that are important for bone health include: Phosphorus. This mineral is found in meat, poultry, dairy foods, nuts, and legumes. The recommended daily intake for adult men and adult women is 700 mg. Magnesium. This mineral is found in seeds, nuts, dark green vegetables, and legumes. The recommended daily intake for adult men is 400-420 mg. For adult women, it is 310-320 mg. Vitamin K. This vitamin is found in green leafy vegetables. The recommended daily intake is 120 mcg for adult men and 90 mcg for adult women. What type of physical activity is best for building and maintaining healthy bones? Weight-bearing and strength-building activities are important for building and maintaining healthy bones. Weight-bearing activities cause muscles and bones to work against gravity. Strength-building activities   increase the strength of the muscles that support bones. Weight-bearing and muscle-building activities include: Walking and hiking. Jogging and running. Dancing. Gym exercises. Lifting weights. Tennis and racquetball. Climbing stairs. Aerobics. Adults should get at least 30 minutes of moderate physical activity on most days. Children should get at least 60 minutes of moderate physical activity on most days. Ask your health care provider what type of exercise is best for you. How can I find out if my bone mass is low? Bone mass can be measured with an X-ray test called a bone mineral density (BMD) test. This test is recommended for all women who are age 65 or older. It may also be recommended for: Men who are age 70 or older. People who are at risk for osteoporosis because of: Having a long-term disease that weakens bones, such as  kidney disease or rheumatoid arthritis. Having menopause earlier than normal. Taking medicine that weakens bones, such as steroids, thyroid hormones, or hormone treatment for breast cancer or prostate cancer. Smoking. Drinking three or more alcoholic drinks a day. Being underweight. Sedentary lifestyle. If you find that you have a low bone mass, you may be able to prevent osteoporosis or further bone loss by changing your diet and lifestyle. Where can I find more information? Bone Health & Osteoporosis Foundation: www.nof.org/patients National Institutes of Health: www.bones.nih.gov International Osteoporosis Foundation: www.iofbonehealth.org Summary The aging process leads to an overall loss of bone mass in the body, which can increase the likelihood of broken bones and osteoporosis. Eating a well-balanced diet with plenty of calcium and vitamin D will help to protect your bones. Weight-bearing and strength-building activities are also important for building and maintaining strong bones. Bone mass can be measured with an X-ray test called a bone mineral density (BMD) test. This information is not intended to replace advice given to you by your health care provider. Make sure you discuss any questions you have with your health care provider. Document Revised: 04/02/2021 Document Reviewed: 04/02/2021 Elsevier Patient Education  2022 Elsevier Inc.  

## 2021-09-15 ENCOUNTER — Other Ambulatory Visit: Payer: Self-pay | Admitting: Obstetrics and Gynecology

## 2021-09-15 DIAGNOSIS — Z1231 Encounter for screening mammogram for malignant neoplasm of breast: Secondary | ICD-10-CM

## 2021-09-15 DIAGNOSIS — N6313 Unspecified lump in the right breast, lower outer quadrant: Secondary | ICD-10-CM

## 2021-09-15 DIAGNOSIS — R928 Other abnormal and inconclusive findings on diagnostic imaging of breast: Secondary | ICD-10-CM

## 2021-10-14 ENCOUNTER — Other Ambulatory Visit: Payer: Self-pay

## 2021-10-14 ENCOUNTER — Ambulatory Visit
Admission: RE | Admit: 2021-10-14 | Discharge: 2021-10-14 | Disposition: A | Discharge status: Home / Self Care | Payer: Medicare HMO | Source: Ambulatory Visit | Attending: Obstetrics and Gynecology | Admitting: Obstetrics and Gynecology

## 2021-10-14 DIAGNOSIS — R928 Other abnormal and inconclusive findings on diagnostic imaging of breast: Secondary | ICD-10-CM | POA: Diagnosis not present

## 2021-10-14 DIAGNOSIS — N6001 Solitary cyst of right breast: Secondary | ICD-10-CM | POA: Insufficient documentation

## 2021-10-14 DIAGNOSIS — N6313 Unspecified lump in the right breast, lower outer quadrant: Secondary | ICD-10-CM | POA: Insufficient documentation

## 2021-10-14 DIAGNOSIS — Z1231 Encounter for screening mammogram for malignant neoplasm of breast: Secondary | ICD-10-CM | POA: Insufficient documentation

## 2022-07-23 DIAGNOSIS — Z0000 Encounter for general adult medical examination without abnormal findings: Secondary | ICD-10-CM | POA: Insufficient documentation

## 2022-09-08 ENCOUNTER — Other Ambulatory Visit: Payer: Self-pay | Admitting: Family Medicine

## 2022-09-08 DIAGNOSIS — Z1231 Encounter for screening mammogram for malignant neoplasm of breast: Secondary | ICD-10-CM

## 2022-10-29 ENCOUNTER — Ambulatory Visit
Admission: RE | Admit: 2022-10-29 | Discharge: 2022-10-29 | Disposition: A | Discharge status: Home / Self Care | Payer: Medicare HMO | Source: Ambulatory Visit | Attending: Family Medicine | Admitting: Family Medicine

## 2022-10-29 DIAGNOSIS — Z1231 Encounter for screening mammogram for malignant neoplasm of breast: Secondary | ICD-10-CM | POA: Diagnosis not present

## 2023-08-10 DIAGNOSIS — M85852 Other specified disorders of bone density and structure, left thigh: Secondary | ICD-10-CM | POA: Insufficient documentation

## 2023-09-23 ENCOUNTER — Other Ambulatory Visit: Payer: Self-pay | Admitting: Family Medicine

## 2023-09-23 DIAGNOSIS — Z1231 Encounter for screening mammogram for malignant neoplasm of breast: Secondary | ICD-10-CM

## 2023-10-10 IMAGING — MG DIGITAL DIAGNOSTIC BILAT W/ TOMO W/ CAD
8 series · 8 of 24 positions shown · non-contrast
Comparison: Previous exam(s).

CLINICAL DATA: Short-term follow-up for a probably benign mass in
the right breast, initially assessed in October 2019.

EXAM:
DIGITAL DIAGNOSTIC BILATERAL MAMMOGRAM WITH TOMOSYNTHESIS AND CAD;
ULTRASOUND RIGHT BREAST LIMITED
TECHNIQUE: Bilateral digital diagnostic mammography and breast tomosynthesis
was performed. The images were evaluated with computer-aided
detection.; Targeted ultrasound examination of the right breast was
performed

[R MLO synth-2D]
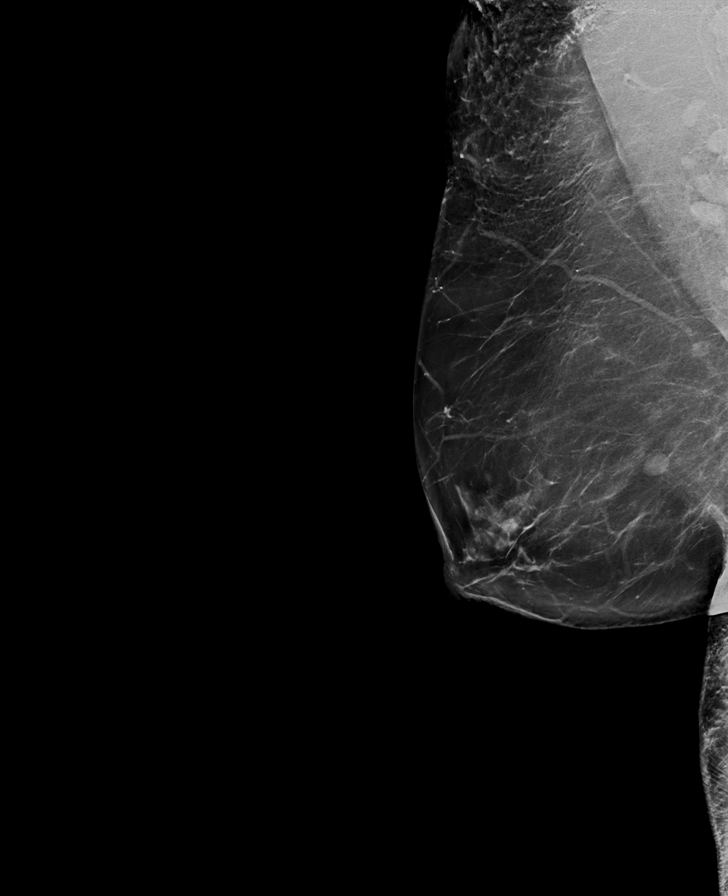

[L CC synth-2D]
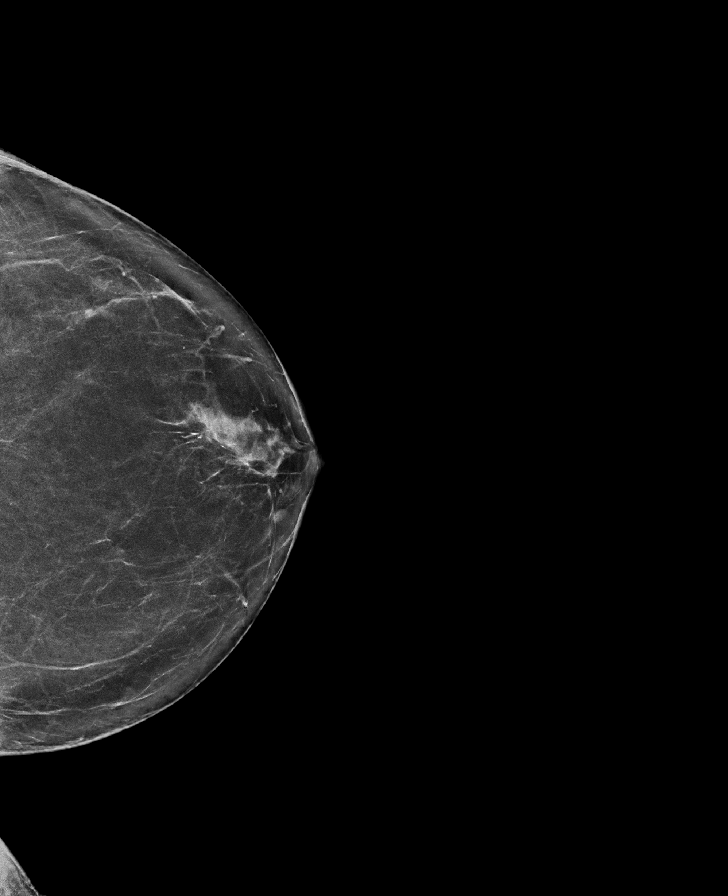

[L MLO synth-2D]
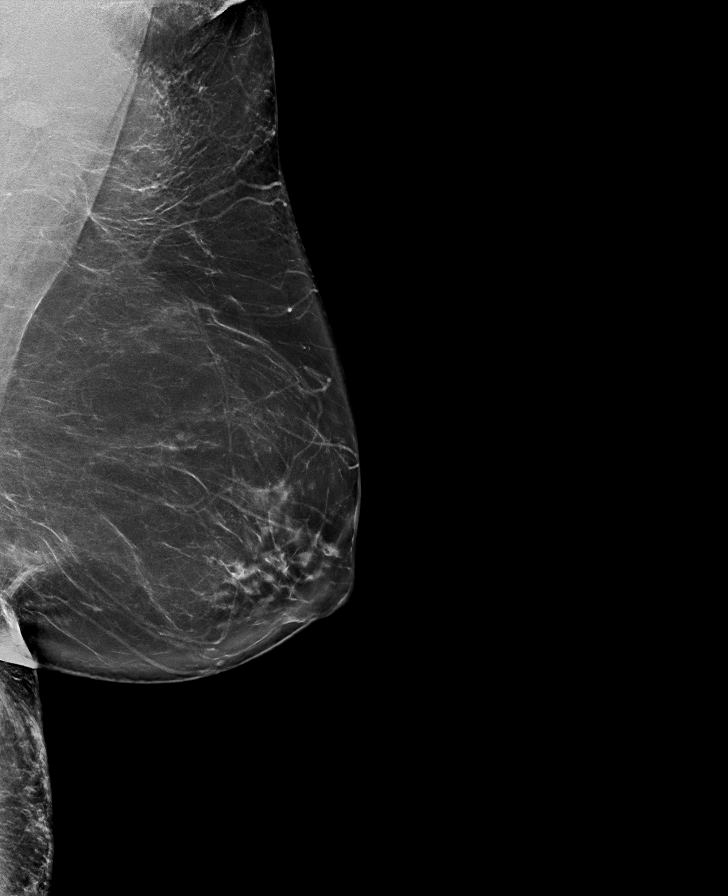

[R CC synth-2D]
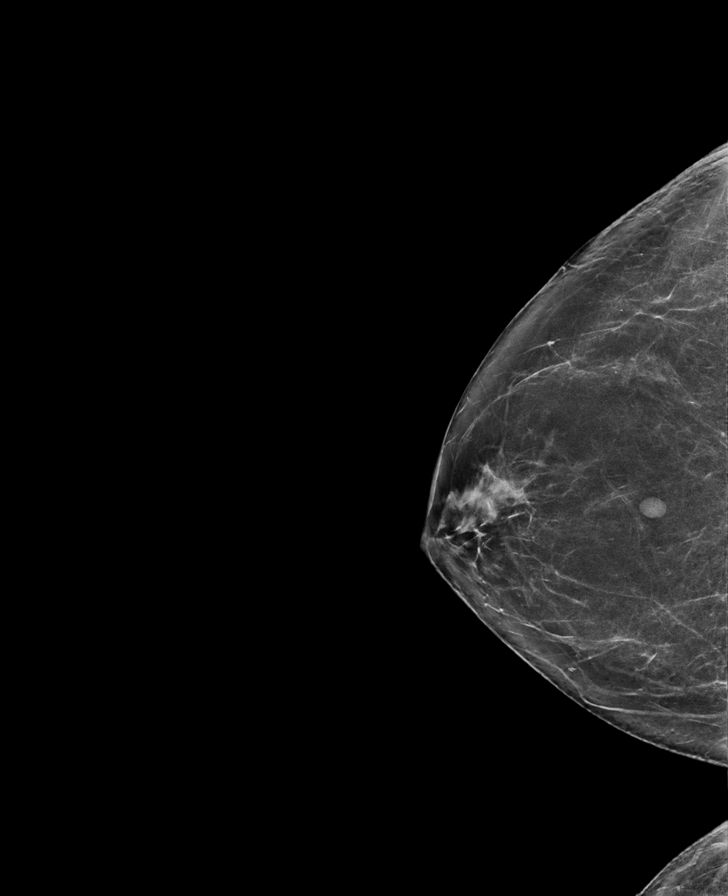

[L CC tomo · tomo slice 37/72.0]
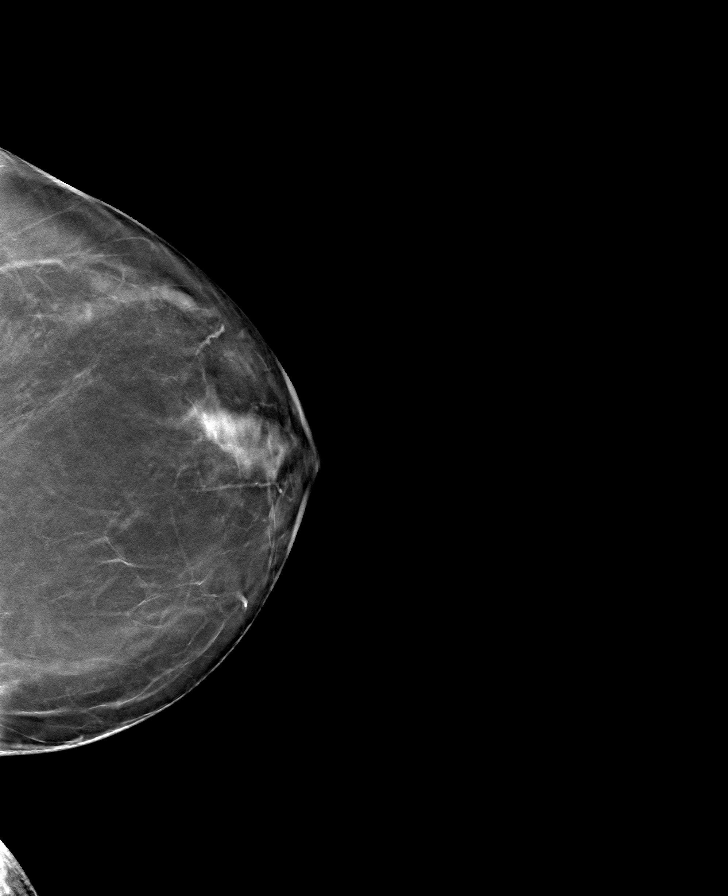

[R CC tomo · tomo slice 35/70.0]
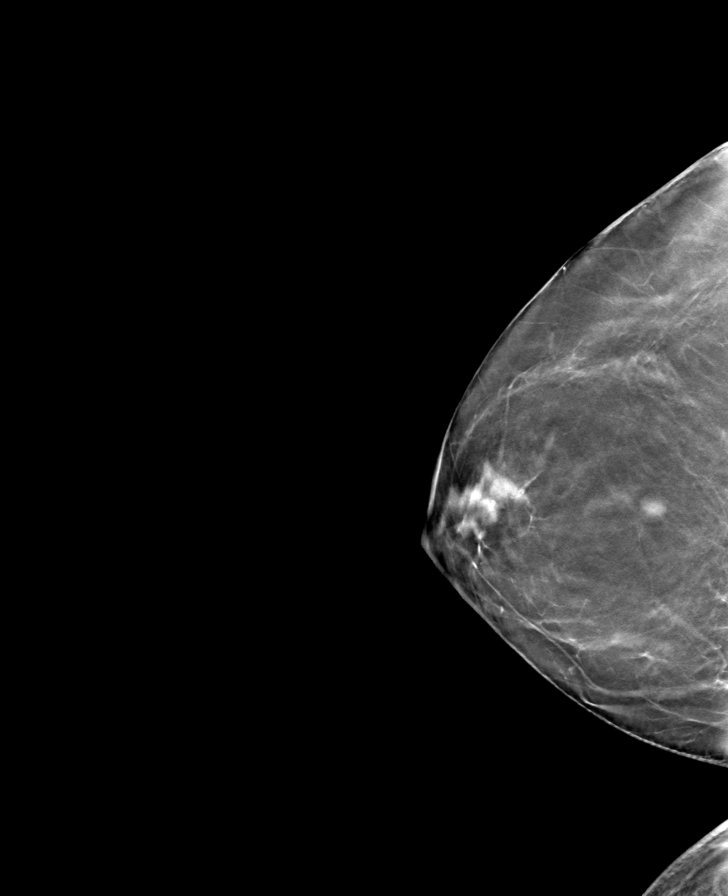

[R MLO tomo · tomo slice 43/85.0]
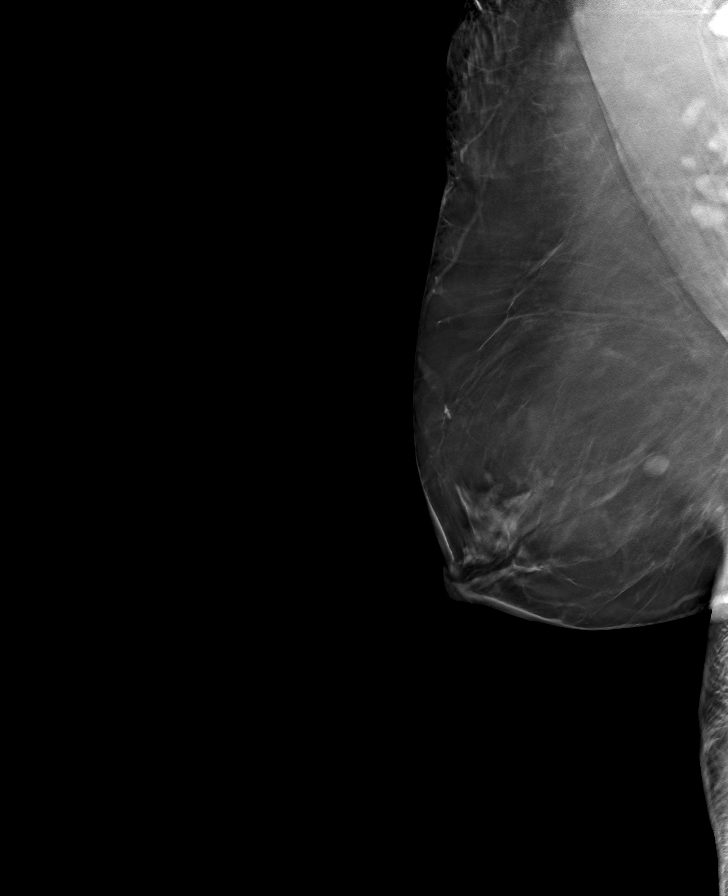

[L MLO tomo · tomo slice 45/89.0]
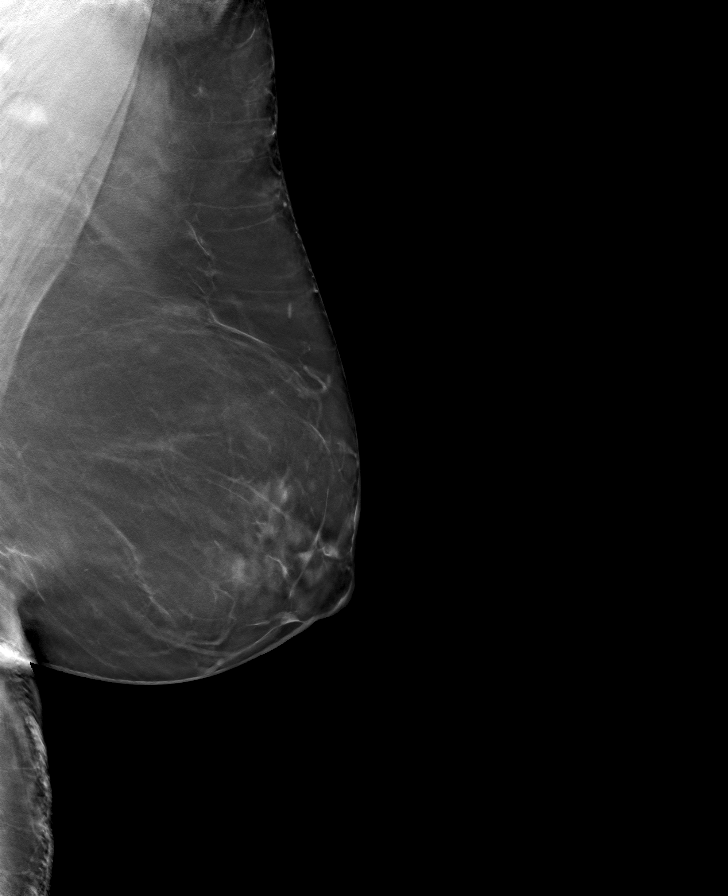

[8 of 24 positions shown; findings below may reference images not displayed]

ACR Breast Density Category b: There are scattered areas of
fibroglandular density.
FINDINGS: Oval circumscribed mass lies in the middle to posterior right
breast, slightly towards the 7 o'clock position, increased in size
since the prior exams, 7 mm in long axis, but with continued benign
mammographic characteristics.

There are no new masses, no areas of architectural distortion and no
suspicious calcifications.

Targeted right breast ultrasound is performed, showing an oval
circumscribed simple cyst in the right breast at 7 o'clock, 2 cm the
nipple measuring 7 x 5 x 6 mm, increased in size from the prior
exams, but now clearly a simple cyst. No solid masses or suspicious
lesions.
IMPRESSION: 1. No evidence of breast malignancy.
2. Benign right breast cyst.

RECOMMENDATION:
Screening mammogram in one year.(Code:[id number]-B7S)

I have discussed the findings and recommendations with the patient.
If applicable, a reminder letter will be sent to the patient
regarding the next appointment.

BI-RADS CATEGORY  2: Benign.

## 2023-10-10 IMAGING — US US BREAST*R* LIMITED INC AXILLA
1 series · 7 of 7 positions shown · non-contrast
Comparison: Previous exam(s).

CLINICAL DATA: Short-term follow-up for a probably benign mass in
the right breast, initially assessed in October 2019.

EXAM:
DIGITAL DIAGNOSTIC BILATERAL MAMMOGRAM WITH TOMOSYNTHESIS AND CAD;
ULTRASOUND RIGHT BREAST LIMITED
TECHNIQUE: Bilateral digital diagnostic mammography and breast tomosynthesis
was performed. The images were evaluated with computer-aided
detection.; Targeted ultrasound examination of the right breast was
performed

[Series 1: us breast*right* limited inc axilla · 0.07mm/px · 7 of 7 slices shown]
[im 1/7]
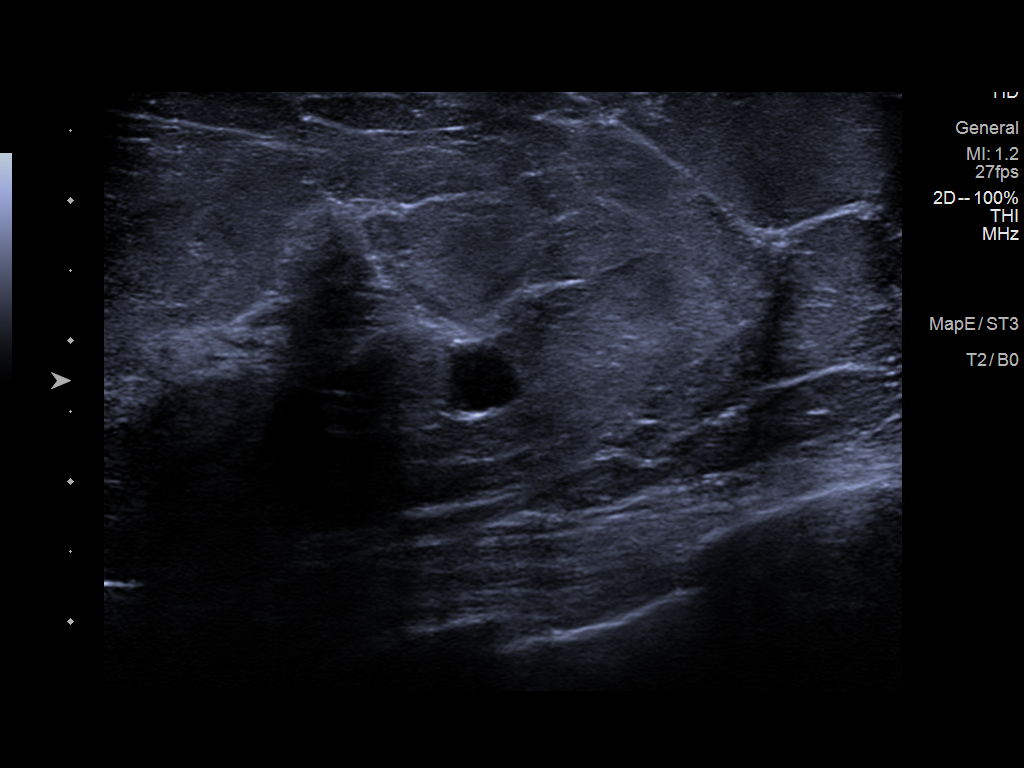
[im 2/7]
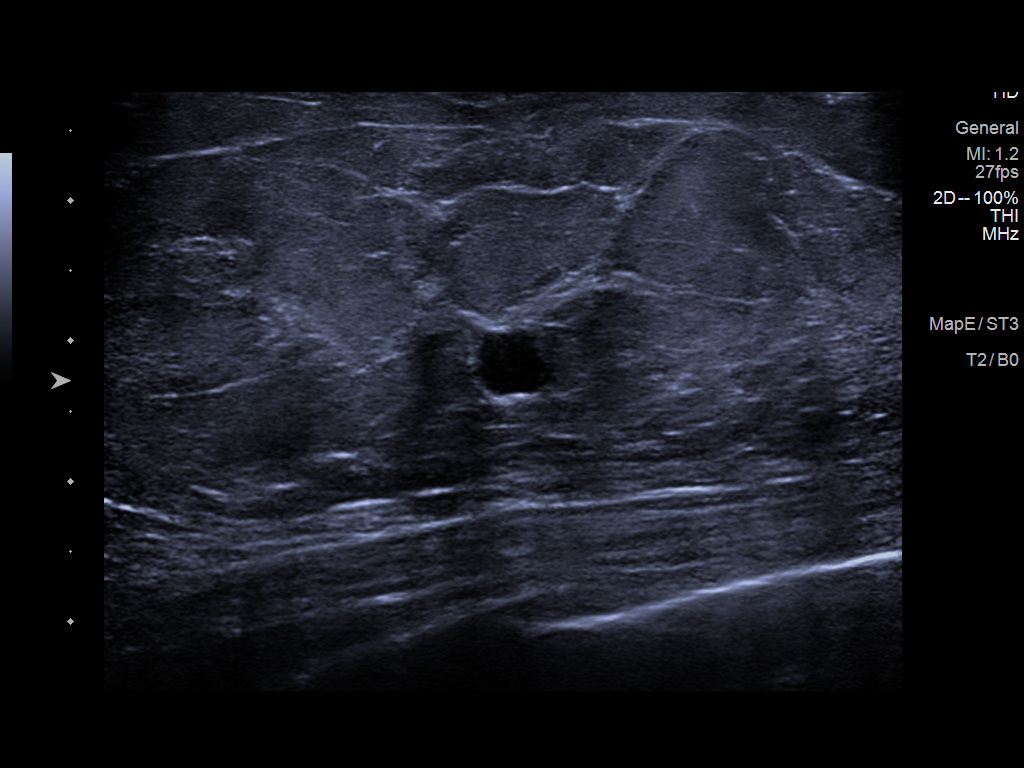
[im 3/7]
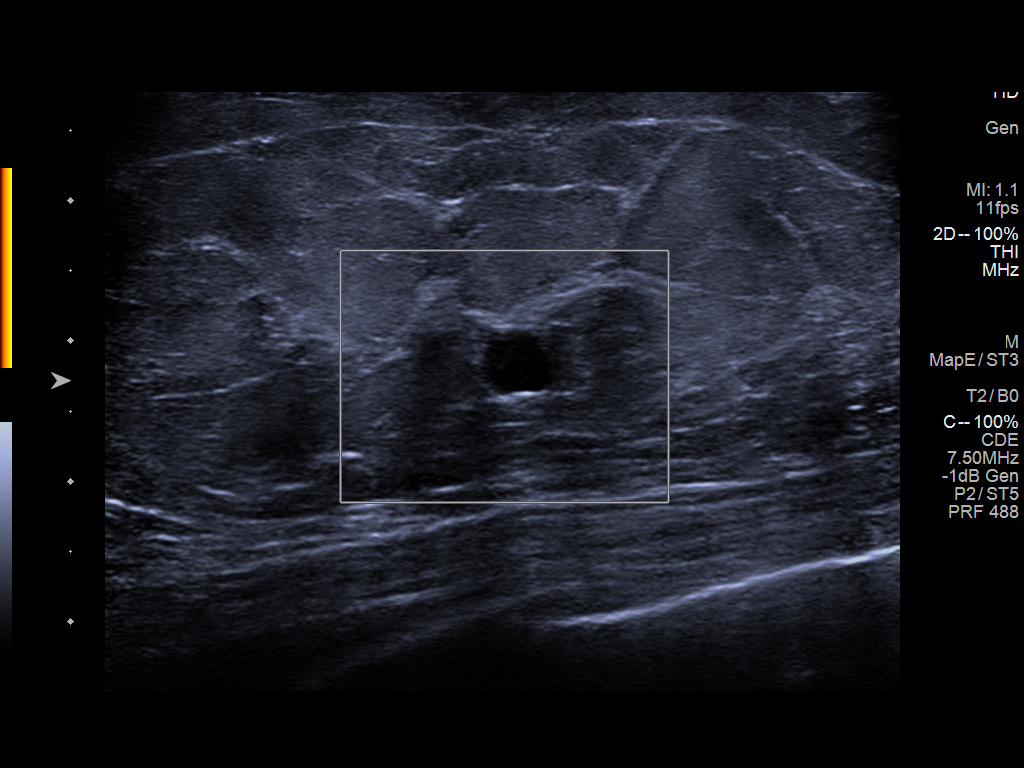
[im 4/7]
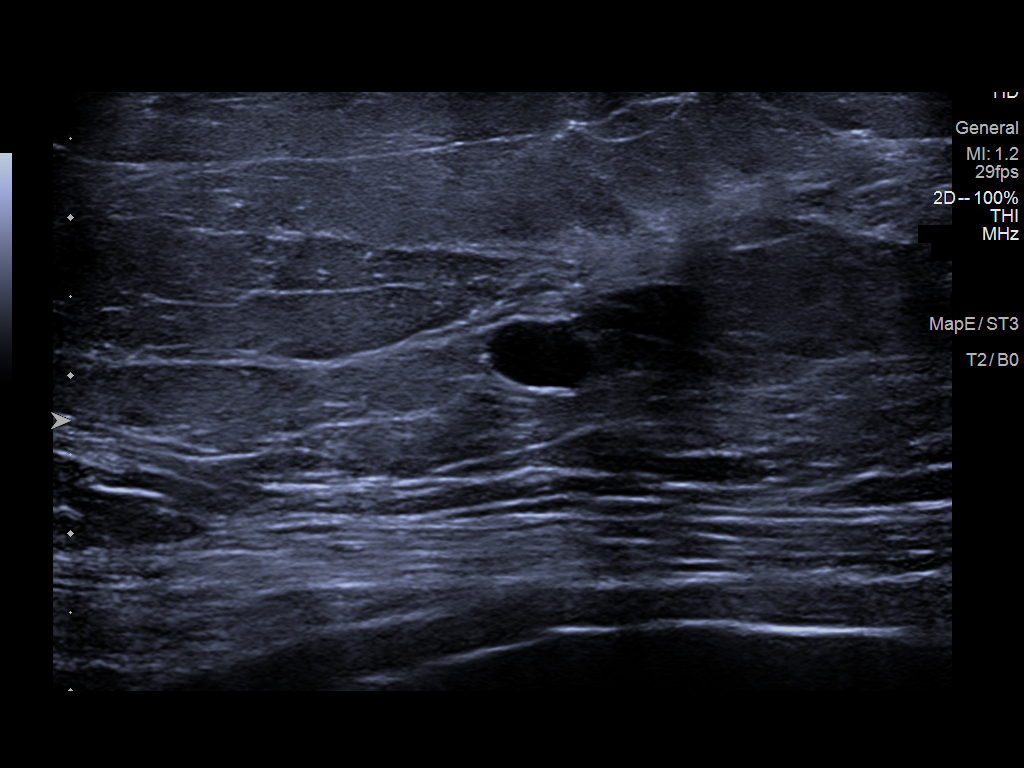
[im 5/7]
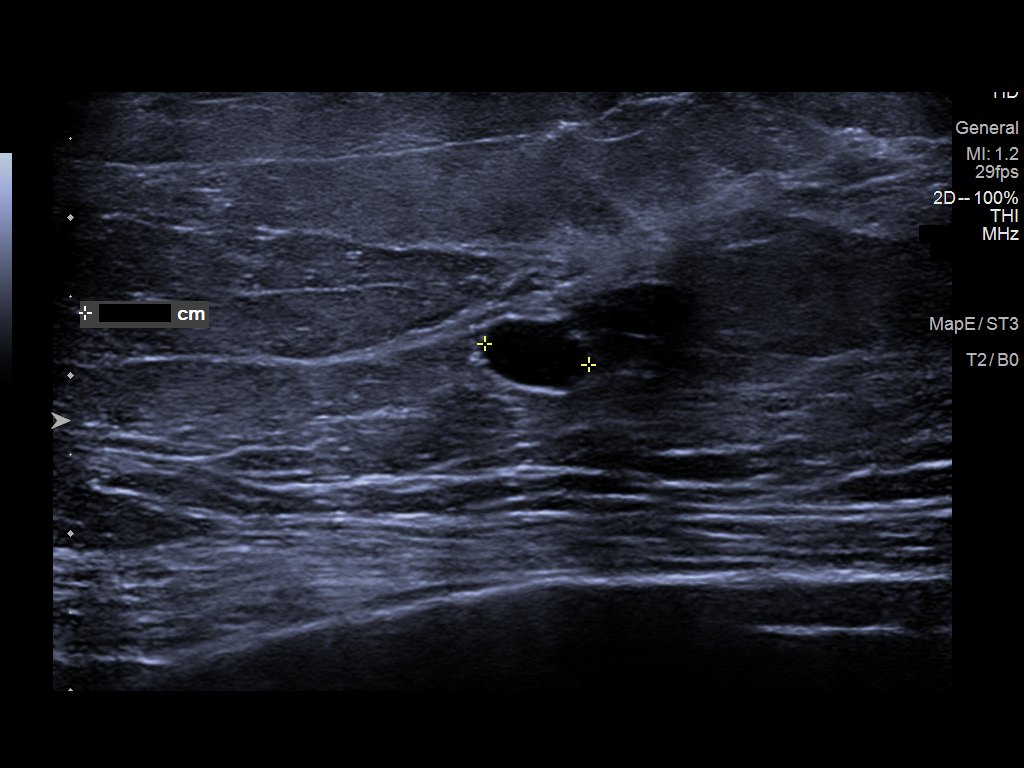
[im 6/7]
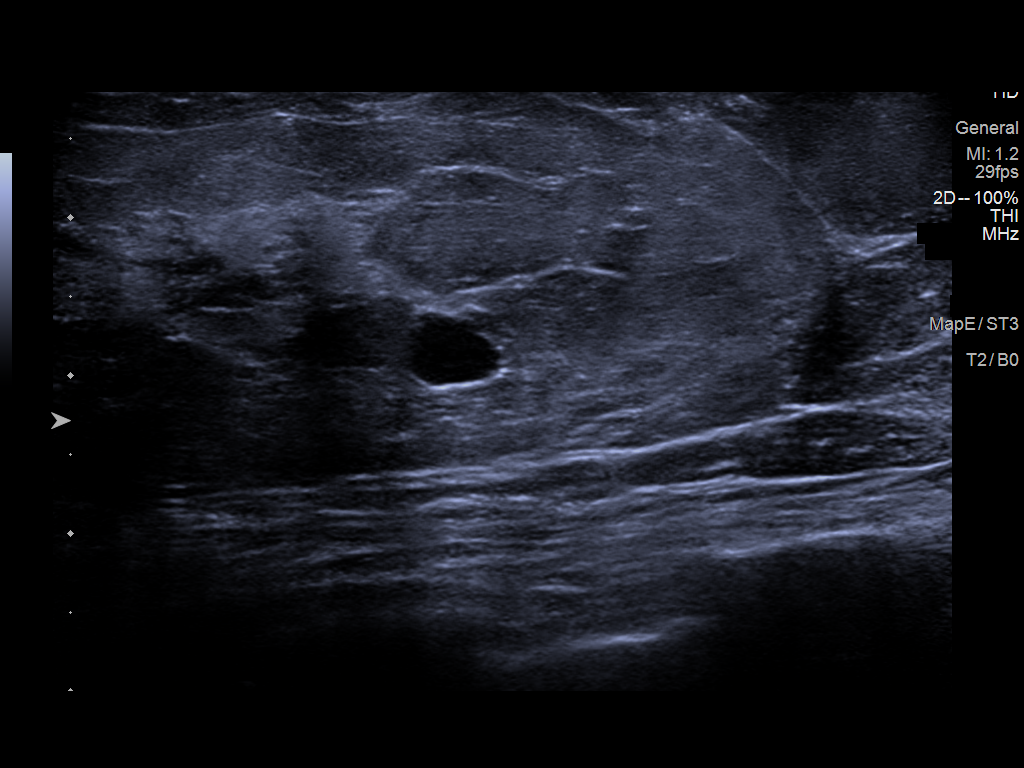
[im 7/7]
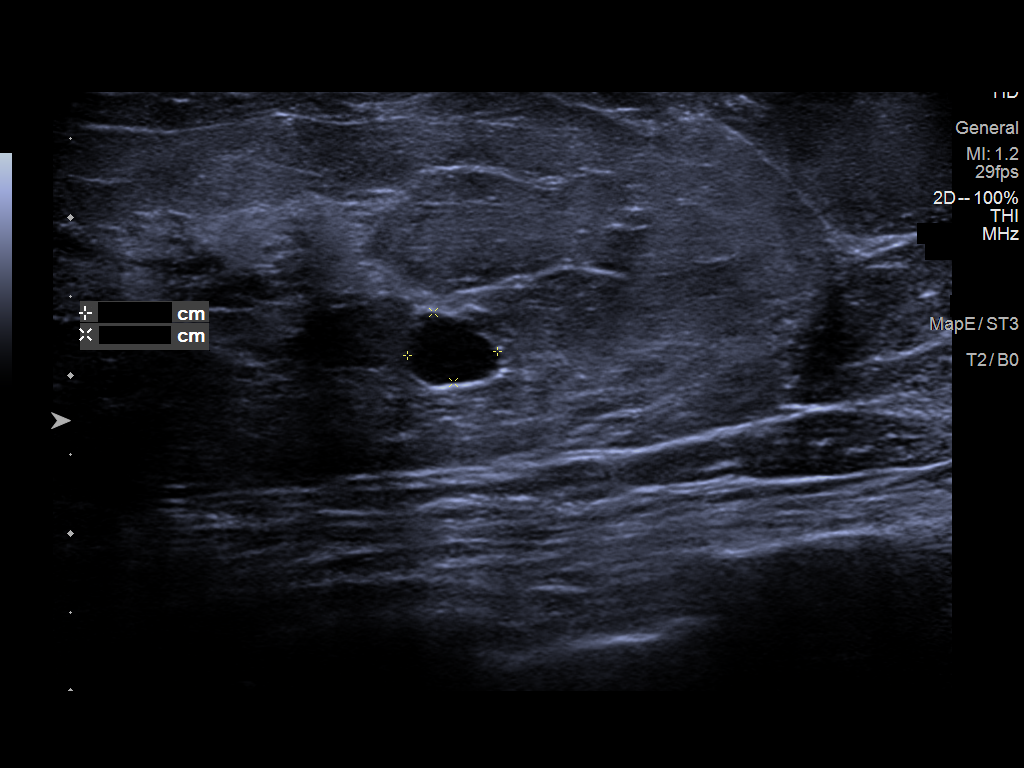

[7 of 7 positions shown; findings below may reference images not displayed]

ACR Breast Density Category b: There are scattered areas of
fibroglandular density.
FINDINGS: Oval circumscribed mass lies in the middle to posterior right
breast, slightly towards the 7 o'clock position, increased in size
since the prior exams, 7 mm in long axis, but with continued benign
mammographic characteristics.

There are no new masses, no areas of architectural distortion and no
suspicious calcifications.

Targeted right breast ultrasound is performed, showing an oval
circumscribed simple cyst in the right breast at 7 o'clock, 2 cm the
nipple measuring 7 x 5 x 6 mm, increased in size from the prior
exams, but now clearly a simple cyst. No solid masses or suspicious
lesions.
IMPRESSION: 1. No evidence of breast malignancy.
2. Benign right breast cyst.

RECOMMENDATION:
Screening mammogram in one year.(Code:RI-8-B7S)

I have discussed the findings and recommendations with the patient.
If applicable, a reminder letter will be sent to the patient
regarding the next appointment.

BI-RADS CATEGORY  2: Benign.

## 2023-11-01 ENCOUNTER — Ambulatory Visit
Admission: RE | Admit: 2023-11-01 | Discharge: 2023-11-01 | Disposition: A | Discharge status: Home / Self Care | Payer: Medicare HMO | Source: Ambulatory Visit | Attending: Family Medicine | Admitting: Family Medicine

## 2023-11-01 DIAGNOSIS — Z1231 Encounter for screening mammogram for malignant neoplasm of breast: Secondary | ICD-10-CM | POA: Diagnosis present

## 2024-03-09 ENCOUNTER — Ambulatory Visit: Admitting: Podiatry

## 2024-03-09 DIAGNOSIS — L600 Ingrowing nail: Secondary | ICD-10-CM

## 2024-03-09 NOTE — Progress Notes (Signed)
 Subjective:  Patient ID: Shelby Clements, female    DOB: 1956/07/27,  MRN: [id number]  Chief Complaint  Patient presents with   Ingrown Toenail    68 y.o. female presents with the above complaint.  Patient presents with left hallux medial border ingrown painful to touch is progressive gotten worse worse with ambulation or shoe pressure patient would like to have it removed she has not seen MRIs prior to seeing me pain scale 7 out of 10 dull aching nature   Review of Systems: Negative except as noted in the HPI. Denies N/V/F/Ch.  No past medical history on file.  Current Outpatient Medications:    atorvastatin (LIPITOR) 20 MG tablet, Take 20 mg by mouth., Disp: , Rfl:    Azelaic Acid 15 % gel, APPLY TO AFFECTED AREA(S) ONCE DAILY AS DIRECTED, Disp: , Rfl:    Lancets (ONETOUCH DELICA PLUS LANCET33G) MISC, CHECK BLOOD SUGAR ONCE DAILY FASTING. DX E11.69 ONE TOUCH, Disp: , Rfl:    metFORMIN (GLUCOPHAGE-XR) 500 MG 24 hr tablet, Take 500 mg by mouth., Disp: , Rfl:    metroNIDAZOLE (METROGEL) 0.75 % gel, Apply topically 2 (two) times daily., Disp: , Rfl:    zolpidem (AMBIEN) 5 MG tablet, Take 5 mg by mouth at bedtime as needed., Disp: , Rfl:    alendronate (FOSAMAX) 70 MG tablet, Take 70 mg by mouth once a week., Disp: , Rfl:    Calcium Carb-Cholecalciferol 600-10 MG-MCG TABS, Take 1 tablet by mouth., Disp: , Rfl:    Doxepin HCl 3 MG TABS, Take 1 tablet by mouth at bedtime., Disp: , Rfl:    doxycycline (PERIOSTAT) 20 MG tablet, Take by mouth. (Patient not taking: Reported on 09/08/2021), Disp: , Rfl:    fluticasone (FLONASE) 50 MCG/ACT nasal spray, Place into the nose., Disp: , Rfl:    guaiFENesin (MUCINEX) 600 MG 12 hr tablet, Take 600 mg by mouth., Disp: , Rfl:    levothyroxine (SYNTHROID) 50 MCG tablet, Take by mouth., Disp: , Rfl:    omeprazole (PRILOSEC) 20 MG capsule, Take 1 tablet by mouth daily., Disp: , Rfl:    ONETOUCH VERIO test strip, daily., Disp: , Rfl:    pravastatin (PRAVACHOL)  20 MG tablet, Take by mouth., Disp: , Rfl:    sertraline (ZOLOFT) 100 MG tablet, Take 1 tablet by mouth daily., Disp: , Rfl:   Social History   Tobacco Use  Smoking Status Not on file  Smokeless Tobacco Not on file    Allergies  Allergen Reactions   Trazodone Other (See Comments)    Nightmares   Objective:  There were no vitals filed for this visit. There is no height or weight on file to calculate BMI. Constitutional Well developed. Well nourished.  Vascular Dorsalis pedis pulses palpable bilaterally. Posterior tibial pulses palpable bilaterally. Capillary refill normal to all digits.  No cyanosis or clubbing noted. Pedal hair growth normal.  Neurologic Normal speech. Oriented to person, place, and time. Epicritic sensation to light touch grossly present bilaterally.  Dermatologic Painful ingrowing nail at medial nail borders of the hallux nail left. No other open wounds. No skin lesions.  Orthopedic: Normal joint ROM without pain or crepitus bilaterally. No visible deformities. No bony tenderness.   Radiographs: None Assessment:   1. Ingrown left big toenail    Plan:  Patient was evaluated and treated and all questions answered.  Ingrown Nail, left -Patient elects to proceed with minor surgery to remove ingrown toenail removal today. Consent reviewed and signed by patient. -Ingrown  nail excised. See procedure note. -Educated on post-procedure care including soaking. Written instructions provided and reviewed. -Patient to follow up in 2 weeks for nail check.  Procedure: Excision of Ingrown Toenail Location: Left 1st toe medial nail borders. Anesthesia: Lidocaine 1% plain; 1.5 mL and Marcaine 0.5% plain; 1.5 mL, digital block. Skin Prep: Betadine. Dressing: Silvadene; telfa; dry, sterile, compression dressing. Technique: Following skin prep, the toe was exsanguinated and a tourniquet was secured at the base of the toe. The affected nail border was freed, split  with a nail splitter, and excised. Chemical matrixectomy was then performed with phenol and irrigated out with alcohol. The tourniquet was then removed and sterile dressing applied. Disposition: Patient tolerated procedure well. Patient to return in 2 weeks for follow-up.   No follow-ups on file.

## 2024-03-09 NOTE — Patient Instructions (Signed)

## 2024-10-02 ENCOUNTER — Other Ambulatory Visit: Payer: Self-pay | Admitting: Family Medicine

## 2024-10-02 DIAGNOSIS — Z1231 Encounter for screening mammogram for malignant neoplasm of breast: Secondary | ICD-10-CM

## 2024-11-01 ENCOUNTER — Ambulatory Visit
Admission: RE | Admit: 2024-11-01 | Discharge: 2024-11-01 | Disposition: A | Discharge status: Home / Self Care | Source: Ambulatory Visit | Attending: Family Medicine | Admitting: Family Medicine

## 2024-11-01 DIAGNOSIS — Z1231 Encounter for screening mammogram for malignant neoplasm of breast: Secondary | ICD-10-CM | POA: Diagnosis present
